# Patient Record
Sex: Female | Born: 1970 | Race: White | Hispanic: No | Marital: Married | Smoking: Never smoker
Health system: Southern US, Community
[De-identification: ages and names within clinical notes are randomized; demographics above are authoritative.]

## PROBLEM LIST (undated history)

## (undated) DIAGNOSIS — Z789 Other specified health status: Secondary | ICD-10-CM

## (undated) HISTORY — DX: Other specified health status: Z78.9

---

## 1998-03-17 ENCOUNTER — Encounter: Admission: RE | Admit: 1998-03-17 | Discharge: 1998-06-15 | Payer: Self-pay | Admitting: Gynecology

## 1998-06-18 ENCOUNTER — Encounter: Payer: Self-pay | Admitting: *Deleted

## 1998-06-18 ENCOUNTER — Ambulatory Visit (HOSPITAL_COMMUNITY): Admission: RE | Admit: 1998-06-18 | Discharge: 1998-06-18 | Payer: Self-pay | Admitting: *Deleted

## 1998-07-15 ENCOUNTER — Inpatient Hospital Stay (HOSPITAL_COMMUNITY): Admission: AD | Admit: 1998-07-15 | Discharge: 1998-07-15 | Payer: Self-pay | Admitting: Gynecology

## 1998-07-17 ENCOUNTER — Inpatient Hospital Stay (HOSPITAL_COMMUNITY): Admission: AD | Admit: 1998-07-17 | Discharge: 1998-07-19 | Payer: Self-pay | Admitting: Gynecology

## 1998-07-23 ENCOUNTER — Encounter: Payer: Self-pay | Admitting: Obstetrics and Gynecology

## 1998-07-23 ENCOUNTER — Ambulatory Visit (HOSPITAL_COMMUNITY): Admission: RE | Admit: 1998-07-23 | Discharge: 1998-07-23 | Payer: Self-pay | Admitting: Obstetrics and Gynecology

## 1998-09-08 ENCOUNTER — Encounter: Payer: Self-pay | Admitting: Gynecology

## 1998-09-08 ENCOUNTER — Inpatient Hospital Stay (HOSPITAL_COMMUNITY): Admission: RE | Admit: 1998-09-08 | Discharge: 1998-09-08 | Payer: Self-pay | Admitting: Gynecology

## 1998-09-10 ENCOUNTER — Inpatient Hospital Stay (HOSPITAL_COMMUNITY): Admission: AD | Admit: 1998-09-10 | Discharge: 1998-09-13 | Payer: Self-pay | Admitting: Obstetrics and Gynecology

## 1998-10-21 ENCOUNTER — Other Ambulatory Visit: Admission: RE | Admit: 1998-10-21 | Discharge: 1998-10-21 | Payer: Self-pay | Admitting: Gynecology

## 2000-04-02 ENCOUNTER — Other Ambulatory Visit: Admission: RE | Admit: 2000-04-02 | Discharge: 2000-04-02 | Payer: Self-pay | Admitting: *Deleted

## 2000-08-22 ENCOUNTER — Inpatient Hospital Stay (HOSPITAL_COMMUNITY): Admission: AD | Admit: 2000-08-22 | Discharge: 2000-08-24 | Payer: Self-pay | Admitting: Gynecology

## 2000-08-22 ENCOUNTER — Encounter: Payer: Self-pay | Admitting: Gynecology

## 2000-10-04 ENCOUNTER — Encounter: Payer: Self-pay | Admitting: Gynecology

## 2000-10-04 ENCOUNTER — Ambulatory Visit (HOSPITAL_COMMUNITY): Admission: RE | Admit: 2000-10-04 | Discharge: 2000-10-04 | Payer: Self-pay | Admitting: *Deleted

## 2000-10-06 ENCOUNTER — Encounter (INDEPENDENT_AMBULATORY_CARE_PROVIDER_SITE_OTHER): Payer: Self-pay | Admitting: Specialist

## 2000-10-06 ENCOUNTER — Inpatient Hospital Stay (HOSPITAL_COMMUNITY): Admission: AD | Admit: 2000-10-06 | Discharge: 2000-10-09 | Payer: Self-pay | Admitting: Gynecology

## 2000-11-19 ENCOUNTER — Other Ambulatory Visit: Admission: RE | Admit: 2000-11-19 | Discharge: 2000-11-19 | Payer: Self-pay | Admitting: Gynecology

## 2001-09-17 ENCOUNTER — Emergency Department (HOSPITAL_COMMUNITY): Admission: EM | Admit: 2001-09-17 | Discharge: 2001-09-18 | Payer: Self-pay | Admitting: Internal Medicine

## 2001-09-18 ENCOUNTER — Encounter: Payer: Self-pay | Admitting: Internal Medicine

## 2003-03-09 ENCOUNTER — Emergency Department (HOSPITAL_COMMUNITY): Admission: EM | Admit: 2003-03-09 | Discharge: 2003-03-09 | Payer: Self-pay | Admitting: *Deleted

## 2003-03-09 ENCOUNTER — Encounter: Payer: Self-pay | Admitting: *Deleted

## 2007-09-12 ENCOUNTER — Emergency Department (HOSPITAL_COMMUNITY): Admission: EM | Admit: 2007-09-12 | Discharge: 2007-09-13 | Payer: Self-pay | Admitting: Emergency Medicine

## 2010-12-02 NOTE — Op Note (Signed)
[facility]  Patient:    Carrie Copeland, Carrie Copeland                 MRN: [id number] Proc. Date: 10/06/00 Adm. Date:  [id number] Attending:  Tonye Royalty                           Operative Report  PREOPERATIVE DIAGNOSES:       1. Term intrauterine pregnancy.                               2. Spontaneous rupture of membrane.                               3. Persistent fetal bradycardia.  POSTOPERATIVE DIAGNOSES:      1. Term intrauterine pregnancy.                               2. Spontaneous rupture of membrane.                               3. Persistent fetal bradycardia.  PROCEDURE PERFORMED:          Emergency primary lower uterine segment                               transverse cesarean section.  SURGEON:                      Juan H. Lily Peer, M.D.  FIRST ASSISTANT:              Conni Elliot, M.D.  ANESTHESIA:                   Emergency general endotracheal anesthesia.  ESTIMATED BLOOD LOSS:  INDICATIONS:                  A 40 year old gravida 2, para 1, with last menstrual period January 15, 2000.  Estimated date of confinement of October 20, 2000.  The patient is currently [redacted] weeks gestation and had presented to [facility] at 1631 hours complaining of spontaneous rupture of membranes at approximately 1545 hours.  On arrival to [facility], her blood pressure was 136/91 and then 122/83.  Pulse was 85.  Temperature had not been taken yet and she was contracting every 2-4 minutes apart with the fetal heart rate reassuring at 140-145 beats per minute.  She had gross rupture of membranes, positive nitrazine, and positive ferning.  She was brought to labor and delivery, and was contracting spontaneously with a reassuring heart rate tracing and stable vital signs at approximately 1737 hours, and had an episode of fetal bradycardia that did not respond to O2 administration, lateral positioning, or knee/chest position.  She was  taken to the operating room for an emergency C-section.  The baby was delivered at 1757 hours.  Of note, there has been discrepancy between the fetal heart rate monitor time and the operating room time.  The fetal heart rate monitoring strip states that the patient arrived in the operating room at 1749 hours and the baby was delivered at 1757 hours.  The patient was taken to  the operating room by Dr. Corky Sox due to the fact that I was in route to the hospital and the fetal bradycardia was still persistent.  Upon my arrival, the patients abdomen was prepped and draped, and I was able to initiate the skin incision and start the cesarean section. Of note, the heart rate before commencing the cesarean section in the operating room, the heart rate was in the 100 beat per minute range.  DESCRIPTION OF PROCEDURE:     After the patient was verbally counseled in route to the operating room.  Once arriving to the operating room after the drapes were in place, the fetal scalp electrode was removed.  The abdomen was prepped and draped in the usual sterile fashion.  A Pfannenstiel skin incision was made 2 cm above the symphysis pubis.  The incision was carried down from the skin and subcutaneous tissue down to the rectus fascia whereby a midline nick was made.  The fascia was incised in a transverse fashion.  The midline raphe was entered and the peritoneal cavity was entered.  The bladder flap was established and the lower uterine segment was incised in a transverse fashion. Clear amniotic fluid was present.  The newborns head was delivered as was the rest of the body.  The nasopharyngeal area was bulb suctioned.  The cord was doubly clamped and tied, and passed off to the pediatricians that were in attendance.  After cord blood was obtained, the placenta was delivered from the intrauterine cavity, and the remaining products of conception were removed. The uterus was then exteriorized and  the lower uterine segment incision was closed with a running stitch of 0 Vicryl suture.  The uterus was then placed back into the abdominopelvic cavity, and the rectus fascia was closed with a running stitch of 0 Vicryl suture.  The subcutaneous bleeders were Bovie cauterized and the skin was reapproximated with skin clips followed by placement of Xeroform gauze.  The patient was then extubated and transferred to the recovery room with stable vital signs.  Blood loss for the procedure was 800 cc.  Urine output was 100 cc.  IV fluid was 1500 cc of lactated Ringers.  The patient delivered a viable female infant with Apgars of 7 and 8.  Arterial cord pH was 7.13 and a weight of 8 pounds and 15 ounces. DD:  10/06/00 TD:  10/08/00 Job: [id number]

## 2010-12-02 NOTE — Discharge Summary (Signed)
[facility]  Patient:    Carrie Copeland, Carrie Copeland                 MRN: [id number] Adm. Date:  [id number] Disc. Date: [id number] Attending:  Tonye Royalty Dictator:   Antony Contras, N.P.                           Discharge Summary  DISCHARGE DIAGNOSES:          Intrauterine pregnancy at [redacted] weeks gestation, spontaneous rupture of membranes, persistent fetal bradycardia.  PROCEDURE:                    Emergency primary low cervical transverse cesarean section.  HISTORY OF PRESENT ILLNESS:   Patient is a 40 year old gravida 2, para 1-0-0-1 with LMP January 21, 2000, [facility] October 20, 2000.  Prenatal risk factors include a history of bilateral hydroureter in patients daughter.  Husband also had a nephrectomy for unilateral renal blockage.  Also, renal pyelectasis and macrosomia in fetus.  PRENATAL LABORATORIES:        Blood type O+.  Antibody screen negative.  RPR, HBSAG, HIV nonreactive.  Rubella immune.  HOSPITAL COURSE:              Patient was admitted on October 06, 2000 with spontaneous rupture of membranes.  Initially upon admission she was noted to be contracting spontaneously with reassuring heart rate, stable vital signs, and then developed an episode of fetal bradycardia which did not respond to oxygen, lateral positioning, or knee-chest position.  She was immediately taken to the operating room for an emergency low cervical transverse cesarean section and was delivered of a viable 7/8 Apgar female weighing 8 pounds 5 ounces.  Procedure was performed by Dr. Lily Peer, assisted by Dr. Corky Sox under emergency general endotracheal anesthesia.  Postoperatively patient remained afebrile, had no difficulty voiding, was able to be discharged in satisfactory condition on her third postoperative day.  LABORATORIES:                 CBC:  Hematocrit 31.3, hemoglobin 10.4, platelets 163,000.  DISPOSITION:                  Follow-up in six weeks.   Continue prenatal vitamins and iron.  Motrin and Tylox for pain.DD:  10/29/00 TD:  10/29/00 Job: [id number]
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
## 2011-10-28 ENCOUNTER — Emergency Department (HOSPITAL_COMMUNITY): Payer: 59

## 2011-10-28 ENCOUNTER — Encounter (HOSPITAL_COMMUNITY): Payer: Self-pay | Admitting: *Deleted

## 2011-10-28 ENCOUNTER — Emergency Department (HOSPITAL_COMMUNITY)
Admission: EM | Admit: 2011-10-28 | Discharge: 2011-10-29 | Disposition: A | Discharge status: Home / Self Care | Payer: 59 | Attending: Emergency Medicine | Admitting: Emergency Medicine

## 2011-10-28 DIAGNOSIS — D72829 Elevated white blood cell count, unspecified: Secondary | ICD-10-CM | POA: Insufficient documentation

## 2011-10-28 DIAGNOSIS — R079 Chest pain, unspecified: Secondary | ICD-10-CM | POA: Insufficient documentation

## 2011-10-28 MED ORDER — FAMOTIDINE 20 MG PO TABS
20.0000 mg | ORAL_TABLET | Freq: Once | ORAL | Status: AC
  Administered 2011-10-29: 20 mg via ORAL
  Filled 2011-10-28: qty 1

## 2011-10-28 MED ORDER — NITROGLYCERIN 0.4 MG SL SUBL
0.4000 mg | SUBLINGUAL_TABLET | SUBLINGUAL | Status: DC | PRN
  Filled 2011-10-28: qty 25

## 2011-10-28 MED ORDER — GI COCKTAIL ~~LOC~~
30.0000 mL | Freq: Once | ORAL | Status: AC
  Administered 2011-10-29: 30 mL via ORAL
  Filled 2011-10-28: qty 30

## 2011-10-28 MED ORDER — PANTOPRAZOLE SODIUM 40 MG IV SOLR
40.0000 mg | Freq: Once | INTRAVENOUS | Status: AC
  Administered 2011-10-29: 40 mg via INTRAVENOUS
  Filled 2011-10-28: qty 40

## 2011-10-28 NOTE — ED Notes (Signed)
Pt reports chest pain began about 30 minutes ago.  States that pain is in middle of chest. Reports mild nausea and SOB with pain.

## 2011-10-29 LAB — COMPREHENSIVE METABOLIC PANEL
AST: 38 U/L — ABNORMAL HIGH (ref 0–37)
CO2: 27 mEq/L (ref 19–32)
Calcium: 9.2 mg/dL (ref 8.4–10.5)
Creatinine, Ser: 0.81 mg/dL (ref 0.50–1.10)
GFR calc Af Amer: >90 mL/min (ref >90)
GFR calc non Af Amer: >90 mL/min (ref >90)
Glucose, Bld: 117 mg/dL — ABNORMAL HIGH (ref 70–99)
Total Protein: 7.4 g/dL (ref 6.0–8.3)

## 2011-10-29 LAB — CBC
HCT: 36.9 % (ref 36.0–46.0)
MCHC: 33.1 g/dL (ref 30.0–36.0)
Platelets: 261 10*3/uL (ref 150–400)
RDW: 12.9 % (ref 11.5–15.5)
WBC: 21.7 10*3/uL — ABNORMAL HIGH (ref 4.0–10.5)

## 2011-10-29 LAB — POCT I-STAT, CHEM 8
BUN: 17 mg/dL (ref 6–23)
Chloride: 105 mEq/L (ref 96–112)
HCT: 38 % (ref 36.0–46.0)
Potassium: 3.6 mEq/L (ref 3.5–5.1)

## 2011-10-29 LAB — D-DIMER, QUANTITATIVE: D-Dimer, Quant: <0.22 ug/mL-FEU (ref 0.00–0.48)

## 2011-10-29 LAB — URINALYSIS, ROUTINE W REFLEX MICROSCOPIC
Glucose, UA: NEGATIVE mg/dL
Ketones, ur: NEGATIVE mg/dL
Leukocytes, UA: NEGATIVE
Protein, ur: NEGATIVE mg/dL
Urobilinogen, UA: 1 mg/dL (ref 0.0–1.0)

## 2011-10-29 LAB — POCT I-STAT TROPONIN I: Troponin i, poc: 0.01 ng/mL (ref 0.00–0.08)

## 2011-10-29 MED ORDER — PANTOPRAZOLE SODIUM 20 MG PO TBEC: 20.0000 mg | DELAYED_RELEASE_TABLET | Freq: Every day | ORAL | Status: DC

## 2011-10-29 MED ORDER — FAMOTIDINE 20 MG PO TABS: 20.0000 mg | ORAL_TABLET | Freq: Two times a day (BID) | ORAL | Status: DC

## 2011-10-29 NOTE — ED Provider Notes (Signed)
History     CSN: [id number]  Arrival date & time 10/28/11  2319   First MD Initiated Contact with Patient 10/28/11 2336      Chief Complaint  Patient presents with  . Chest Pain    (Consider location/radiation/quality/duration/timing/severity/associated sxs/prior treatment) HPI Substernal chest pain, feels like gas. Pain started epigastric region and moved up. No belching. Unable to sleep due to discomfort that started while at rest. No history of similar symptoms. Patient states she had a big lunch and ate pizza. For dinner she had macaroni and cheese. No fevers or chills. No cough. No back pain. No dysuria. No shortness of breath. No known aggravating or alleviating factors. No leg pain or swelling. A moderate in severity with more severe prior to arriving at the emergency department. She did not take any medications for this.  History reviewed. No pertinent past medical history.  Past Surgical History  Procedure Date  . Cesarean section     No family history on file.  History  Substance Use Topics  . Smoking status: Never Smoker   . Smokeless tobacco: Not on file  . Alcohol Use: No    OB History    Grav Para Term Preterm Abortions TAB SAB Ect Mult Living                  Review of Systems  Constitutional: Negative for fever and chills.  HENT: Negative for neck pain and neck stiffness.   Eyes: Negative for pain.  Respiratory: Negative for shortness of breath.   Cardiovascular: Positive for chest pain. Negative for palpitations and leg swelling.  Gastrointestinal: Negative for abdominal pain.  Genitourinary: Negative for dysuria.  Musculoskeletal: Negative for back pain.  Skin: Negative for rash.  Neurological: Negative for headaches.  All other systems reviewed and are negative.    Allergies  Review of patient's allergies indicates no known allergies.  Home Medications  No current outpatient prescriptions on file.  BP 125/73  Pulse 66  Temp(Src) 98.3  F (36.8 C) (Oral)  Resp 18  Ht 5\' 6"  (1.676 m)  Wt 190 lb (86.183 kg)  BMI 30.67 kg/m2  SpO2 100%  Physical Exam  Constitutional: She is oriented to person, place, and time. She appears well-developed and well-nourished.  HENT:  Head: Normocephalic and atraumatic.  Eyes: Conjunctivae and EOM are normal. Pupils are equal, round, and reactive to light.  Neck: Trachea normal. Neck supple. No thyromegaly present.  Cardiovascular: Normal rate, regular rhythm, S1 normal, S2 normal and normal pulses.     No systolic murmur is present   No diastolic murmur is present  Pulses:      Radial pulses are 2+ on the right side, and 2+ on the left side.  Pulmonary/Chest: Effort normal and breath sounds normal. She has no wheezes. She has no rhonchi. She has no rales. She exhibits no tenderness.  Abdominal: Soft. Normal appearance and bowel sounds are normal. She exhibits no mass. There is no tenderness. There is no rebound, no guarding, no CVA tenderness and negative Murphy's sign.  Musculoskeletal:       BLE:s Calves nontender, no cords or erythema, negative Homans sign  Neurological: She is alert and oriented to person, place, and time. She has normal strength. No cranial nerve deficit or sensory deficit. GCS eye subscore is 4. GCS verbal subscore is 5. GCS motor subscore is 6.  Skin: Skin is warm and dry. No rash noted. She is not diaphoretic.  Psychiatric: Her speech is  normal.       Cooperative and appropriate    ED Course  Procedures (including critical care time)  Labs Reviewed  CBC - Abnormal; Notable for the following:    WBC 21.7 (*)    All other components within normal limits  COMPREHENSIVE METABOLIC PANEL - Abnormal; Notable for the following:    Glucose, Bld 117 (*)    AST 38 (*)    All other components within normal limits  POCT I-STAT, CHEM 8 - Abnormal; Notable for the following:    Glucose, Bld 111 (*)    All other components within normal limits  D-DIMER, QUANTITATIVE    URINALYSIS, ROUTINE W REFLEX MICROSCOPIC   Dg Chest Portable 1 View  10/29/2011  *RADIOLOGY REPORT*  Clinical Data: Chest pain  PORTABLE CHEST - 1 VIEW  Comparison: None.  Findings: Lungs are essentially clear.  No pleural effusion or pneumothorax.  Cardiomediastinal silhouette is within normal limits.  IMPRESSION: No evidence of acute cardiopulmonary disease.  Original Report Authenticated By: Charline Bills, M.D.    Date: 10/29/2011  Rate: 68  Rhythm: normal sinus rhythm  QRS Axis: normal  Intervals: normal  ST/T Wave abnormalities: nonspecific ST changes  Conduction Disutrbances:none  Narrative Interpretation:   Old EKG Reviewed: none available  ECG- no acute ischemia. Negative cardiac enzymes.  1:51 AM on recheck feels much better after GI cocktail, Protonix and Pepcid.   Labs and x-ray reviewed. Unable to explain elevated white blood cell count 21K - no UTI symptoms, no fevers, no cough, no abdominal tenderness. CMP reviewed without elevated LFTs doubt cholecystitis.    MDM   Chest pain with reflux symptoms improved with GI cocktail, Pepcid, Protonix. Screening EKG and x-ray reviewed as above.  Patient also has elevated white blood cell count without obvious etiology. Plan recheck primary care physician for repeat labs and further evaluation. Reliable historian and agrees to strict return precautions for any worsening condition.        Sunnie Nielsen, MD 10/29/11 [phone]

## 2011-10-29 NOTE — Discharge Instructions (Signed)

## 2011-11-03 ENCOUNTER — Other Ambulatory Visit (HOSPITAL_COMMUNITY): Payer: Self-pay | Admitting: Pediatrics

## 2011-11-07 ENCOUNTER — Ambulatory Visit (HOSPITAL_COMMUNITY)
Admission: RE | Admit: 2011-11-07 | Discharge: 2011-11-07 | Disposition: A | Discharge status: Home / Self Care | Payer: 59 | Source: Ambulatory Visit | Attending: Pediatrics | Admitting: Pediatrics

## 2011-11-07 ENCOUNTER — Other Ambulatory Visit (HOSPITAL_COMMUNITY): Payer: Self-pay | Admitting: Pediatrics

## 2011-11-07 DIAGNOSIS — R109 Unspecified abdominal pain: Secondary | ICD-10-CM | POA: Insufficient documentation

## 2011-11-14 ENCOUNTER — Other Ambulatory Visit (HOSPITAL_COMMUNITY): Payer: Self-pay | Admitting: Pediatrics

## 2011-11-14 DIAGNOSIS — R1011 Right upper quadrant pain: Secondary | ICD-10-CM

## 2011-11-14 DIAGNOSIS — R110 Nausea: Secondary | ICD-10-CM

## 2011-11-16 ENCOUNTER — Encounter (HOSPITAL_COMMUNITY)
Admission: RE | Admit: 2011-11-16 | Discharge: 2011-11-16 | Disposition: A | Discharge status: Home / Self Care | Payer: 59 | Source: Ambulatory Visit | Attending: Pediatrics | Admitting: Pediatrics

## 2011-11-16 ENCOUNTER — Encounter (HOSPITAL_COMMUNITY): Payer: Self-pay

## 2011-11-16 DIAGNOSIS — R1011 Right upper quadrant pain: Secondary | ICD-10-CM

## 2011-11-16 DIAGNOSIS — R110 Nausea: Secondary | ICD-10-CM

## 2011-11-16 LAB — CBC WITH DIFFERENTIAL/PLATELET
Alkaline Phosphatase: 85 U/L
Amylase: 41 units/L (ref 25–110)
HCT: 43 %
Hemoglobin: 13.8 g/dL (ref 12.0–16.0)
MCV: 95.8 fL
platelet count: 315

## 2011-11-16 MED ORDER — SINCALIDE 5 MCG IJ SOLR
INTRAMUSCULAR | Status: AC
  Administered 2011-11-16: 1.7 ug via INTRAVENOUS
  Filled 2011-11-16: qty 5

## 2011-11-16 MED ORDER — TECHNETIUM TC 99M MEBROFENIN IV KIT
5.0000 | PACK | Freq: Once | INTRAVENOUS | Status: AC | PRN
  Administered 2011-11-16: 4.9 via INTRAVENOUS

## 2011-12-06 ENCOUNTER — Encounter: Payer: Self-pay | Admitting: Gastroenterology

## 2011-12-06 ENCOUNTER — Ambulatory Visit (INDEPENDENT_AMBULATORY_CARE_PROVIDER_SITE_OTHER): Payer: 59 | Admitting: Gastroenterology

## 2011-12-06 VITALS — BP 102/70 | HR 76 | Temp 98.7°F | Ht 66.5 in | Wt 188.2 lb

## 2011-12-06 DIAGNOSIS — R1011 Right upper quadrant pain: Secondary | ICD-10-CM

## 2011-12-06 DIAGNOSIS — R945 Abnormal results of liver function studies: Secondary | ICD-10-CM

## 2011-12-06 DIAGNOSIS — R7989 Other specified abnormal findings of blood chemistry: Secondary | ICD-10-CM

## 2011-12-06 DIAGNOSIS — K8020 Calculus of gallbladder without cholecystitis without obstruction: Secondary | ICD-10-CM

## 2011-12-06 DIAGNOSIS — K8050 Calculus of bile duct without cholangitis or cholecystitis without obstruction: Secondary | ICD-10-CM

## 2011-12-06 NOTE — Progress Notes (Signed)
Primary Care Physician:  Vivia Ewing, MD, MD  Primary Gastroenterologist:  Roetta Sessions, MD   Chief Complaint  Patient presents with  . Abdominal Pain    HPI:  Carrie Copeland is a 41 y.o. 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 Patient states her symptoms began acutely on the evening of April 14th around 10 PM. She had returned from traveling and admits to having eat out quite a bit in the preceding 48 hours including [place], pizza, chicken biscuits. Her last meal was at lunch time today the symptoms began. She developed acute, severe right upper quadrant pain which radiated into the epigastrium and lower substernal chest region. Her husband took her to the emergency department because the pain was so severe. No nausea or vomiting. In the emergency department, her AST was slightly elevated at 38, other LFTs were normal. Her white blood cell count was 21,700, hemoglobin 12.2. She was given a GI cocktail. Patient states he did not start working immediately and her pain started subsiding possibly after receiving IV pain medication. After she returned home several hours later her pain recurred. She saw Dr. Milford Cage and had blood work on April 18. Her white blood cell count was 9000 at that time. Hemoglobin normal. Total bilirubin 0.5, alkaline phosphatase 1:15, AST 26, ALT 184, albumin 4.6. Abdominal ultrasound on April 23 was negative. HIDA scan with CCK challenge on May 2 showed a gallbladder ejection fraction of 89%, retention of activity noted within the common bile duct during post infusion imaging, suspicious for biliary dyskinesia. Patient reported reproduction of symptoms with CCK infusion. Patient confirmed this today.  For the past one month she has been on pepcid and protonix given during ED visit. Recently ran out. Watchs diet very closely. Avoidance of spicy and fatty foods. Salad causes some ruq. Also  fer stools were light colored for a day. No melena rectal bleeding. Only occasional heartburn, self medicates with TUMS with good relief.    Current Outpatient Prescriptions  Medication Sig Dispense Refill  . famotidine (PEPCID) 20 MG tablet Take 1 tablet (20 mg total) by mouth 2 (two) times daily.  30 tablet  0  . pantoprazole (PROTONIX) 20 MG tablet Take 1 tablet (20 mg total) by mouth daily.  30 tablet  0    Allergies as of 12/06/2011  . (No Known Allergies)    Past Medical History  Diagnosis Date  . No pertinent past medical history     Past Surgical History  Procedure Date  . Cesarean section     Family History  Problem Relation Age of Onset  . Colon cancer Neg Hx   . Liver disease Neg Hx     History   Social History  . Marital Status: Married    Spouse Name: N/A    Number of Children: 2  . Years of Education: N/A   Occupational History  . 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 Smoking status: Never Smoker   . Smokeless tobacco: Not on file  . Alcohol Use: No  . Drug Use: No  . Sexually Active:    Other Topics Concern  . Not on file   Social History Narrative  . No narrative on file      ROS:  General: Negative for anorexia, weight loss, fever, chills, fatigue, weakness. Eyes: Negative for vision changes.  ENT: Negative for hoarseness, difficulty  swallowing , nasal congestion. CV: Negative for chest pain, angina, palpitations, dyspnea on exertion, peripheral edema.  Respiratory: Negative for dyspnea at rest, dyspnea on exertion, cough, sputum, wheezing.  GI: See history of present illness. GU:  Negative for dysuria, hematuria, urinary incontinence, urinary frequency, nocturnal urination.  MS: Negative for joint pain, low back pain.  Derm: Negative for rash or itching.  Neuro: Negative for weakness, abnormal sensation, seizure, frequent headaches, memory loss, confusion.  Psych: Negative for anxiety, depression, suicidal  ideation, hallucinations.  Endo: Negative for unusual weight change.  Heme: Negative for bruising or bleeding. Allergy: Negative for rash or hives.    Physical Examination:  BP 102/70  Pulse 76  Temp(Src) 98.7 F (37.1 C) (Temporal)  Ht 5' 6.5" (1.689 m)  Wt 188 lb 3.2 oz (85.367 kg)  BMI 29.92 kg/m2   General: Well-nourished, well-developed in no acute distress.  Head: Normocephalic, atraumatic.   Eyes: Conjunctiva pink, no icterus. Mouth: Oropharyngeal mucosa moist and pink , no lesions erythema or exudate. Neck: Supple without thyromegaly, masses, or lymphadenopathy.  Lungs: Clear to auscultation bilaterally.  Heart: Regular rate and rhythm, no murmurs rubs or gallops.  Abdomen: Bowel sounds are normal, nontender, nondistended, no hepatosplenomegaly or masses, no abdominal bruits or    hernia , no rebound or guarding.   Rectal: not performed Extremities: No lower extremity edema. No clubbing or deformities.  Neuro: Alert and oriented x 4 , grossly normal neurologically.  Skin: Warm and dry, no rash or jaundice.   Psych: Alert and cooperative, normal mood and affect.  Labs: Lab Results  Component Value Date   WBC 21.7* 10/29/2011   HGB 12.9 10/29/2011   HCT 38.0 10/29/2011   MCV 93.7 10/29/2011   PLT 261 10/29/2011   Lab Results  Component Value Date   ALT 26 10/29/2011   AST 38* 10/29/2011   ALKPHOS 83 10/29/2011   BILITOT 0.4 10/29/2011   See HPI for other labs.  Imaging Studies: Nm Hepato W/eject Fract  11/16/2011  *RADIOLOGY REPORT*  Clinical Data:  Right upper quadrant pain and nausea.  NUCLEAR MEDICINE HEPATOBILIARY IMAGING WITH GALLBLADDER EF  Technique:  Sequential images of the abdomen were obtained out to 60 minutes following intravenous administration of radiopharmaceutical.  After slow intravenous infusion of 1.7 micrograms Cholecystokinin, gallbladder ejection fraction was determined.  Radiopharmaceutical:  4.9 mCi Tc-27m mebrofenin  Comparison:  None.   Findings: Prompt radiopharmaceutical uptake by the liver is seen. Breast attenuation artifact is noted, but the liver is otherwise normal in appearance.  Prompt biliary excretion activity is seen.  Gallbladder activity is seen initially on the 25-minute image.  Biliary activity reaches small bowel by 35 minutes.  During intravenous infusion of cholecystokinin, the gallbladder ejection fraction reaches 89%, which is normal.  There is retention of a significant amount of biliary activity within the common bile duct during post infusion imaging, and this is suspicious for biliary dyskinesia.  The patient did experience abdominal pain during CCK infusion.  IMPRESSION:  1.  No evidence of cystic duct or biliary obstruction. 2.  Normal gallbladder ejection fraction of 89%. 3.  Retention of activity noted within the common bile duct during post infusion imaging, suspicious for biliary dyskinesia. The patient did report abdominal pain during CCK infusion.  Original Report Authenticated By: Danae Orleans, M.D.   US Abdomen Limited Ruq  11/07/2011  *RADIOLOGY REPORT*  Clinical Data:  Abdominal pain  LIMITED ABDOMINAL ULTRASOUND - RIGHT UPPER QUADRANT  Comparison:  None.  Findings:  Gallbladder:  No gallstones, gallbladder wall thickening, or pericholecystic fluid.  Negative sonographic Murphy's sign.  Common bile duct:  Measures 5 mm.  Liver:  Within normal limits for parenchymal echogenicity.  No focal hepatic lesion is seen.  IMPRESSION: Negative right upper quadrant ultrasound.  Original Report Authenticated By: Charline Bills, M.D.

## 2011-12-06 NOTE — Assessment & Plan Note (Signed)
Recent acute onset right upper quadrant/epigastric abdominal pain requiring emergency department visit. Reproduction of symptoms with CCK challenge at time of HIDA scan. Notably patient also had bump in her ALT as mentioned above. With food avoidance she has done recently well at this point. I don't think that she has significant GERD or gastritis/peptic ulcer disease that explains her symptoms. Suspect her symptoms are biliary in nature. 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 I have provided her with orders to get her LFTs done 6 hours after her next episode. Patient tells me she had 2 sets of labs done through Dr. Kerin Ransom office. Request all labs. Further recommendations to follow.

## 2011-12-06 NOTE — Progress Notes (Signed)
Faxed to PCP

## 2011-12-07 LAB — HEPATIC FUNCTION PANEL
ALT: 184 U/L — AB (ref 7–35)
AST: 26 U/L
Alkaline Phosphatase: 115 U/L
Bilirubin, Direct: 0.2 mg/dL (ref 0.01–0.4)
Total Bilirubin: 0.5 mg/dL

## 2011-12-18 NOTE — Progress Notes (Signed)
Requested additional labs from PCP but they did not have any.

## 2011-12-18 NOTE — Progress Notes (Signed)
Quick Note:  Only one set of labs from PCP available. WBC much improved.  ALT elevated, previously normal.  Patient to have repeat labs with recurrent pain. To discuss work-up with Dr. Jena Gauss when he returns to work. ______

## 2011-12-25 NOTE — Progress Notes (Signed)
Quick Note:  Received additional labs from PCP. LFTs normal this time. To discuss with RMR upon return. ?SOD? ______

## 2012-02-01 NOTE — Progress Notes (Signed)
Quick Note:  Please get progress report on patient. Seen in 11/2011 with orders for LFTs with next episode of pain. No additional labs done so I assume she has not had another episode.   Discussed with Dr. Jena Gauss. He recommends offering Levsin to have on hand in case another episode of ruq pain. Can call in #[phone] to take one under tongue up to four times daily as needed for abd pain. No refills.  Patient needs OV in 03/2012. If she were to have additional episodes, we would consider mrcp vs eus as next step. ______

## 2012-02-06 LAB — HEPATIC FUNCTION PANEL
Bilirubin, Direct: 0.1 mg/dL (ref 0.0–0.3)
Total Bilirubin: 0.5 mg/dL (ref 0.3–1.2)

## 2012-02-06 NOTE — Progress Notes (Signed)
Quick Note:  Please let pt know her LFTs are normal. How is she doing with regards to pain? ______

## 2012-02-08 NOTE — Progress Notes (Signed)
Quick Note:  Pt aware, rx called to [facility].  Darl Pikes, pt needs ov in 03/2012 please ______

## 2012-02-08 NOTE — Progress Notes (Signed)
Quick Note:  Tried to call pt- LMOM ______ 

## 2012-02-08 NOTE — Progress Notes (Signed)
Quick Note:  Pt aware, she was awakened at 5:30am on Sunday morning with severe pain. She was not able to do these labs until Monday. This was the first severe episode she has had since her ov. She is going to try the levsin that RMR recommended and will come to ov in 03/2012. ______

## 2012-02-09 NOTE — Progress Notes (Signed)
Quick Note:  Agree with OV in 03/2012 with RMR. Levsin prn pain. ______

## 2012-04-02 ENCOUNTER — Encounter: Payer: Self-pay | Admitting: Internal Medicine

## 2012-09-29 IMAGING — CR DG CHEST 1V PORT
1 series · 1 of 1 positions shown · non-contrast
Comparison: None.

CLINICAL DATA: Chest pain

PORTABLE CHEST - 1 VIEW

[view not recorded]
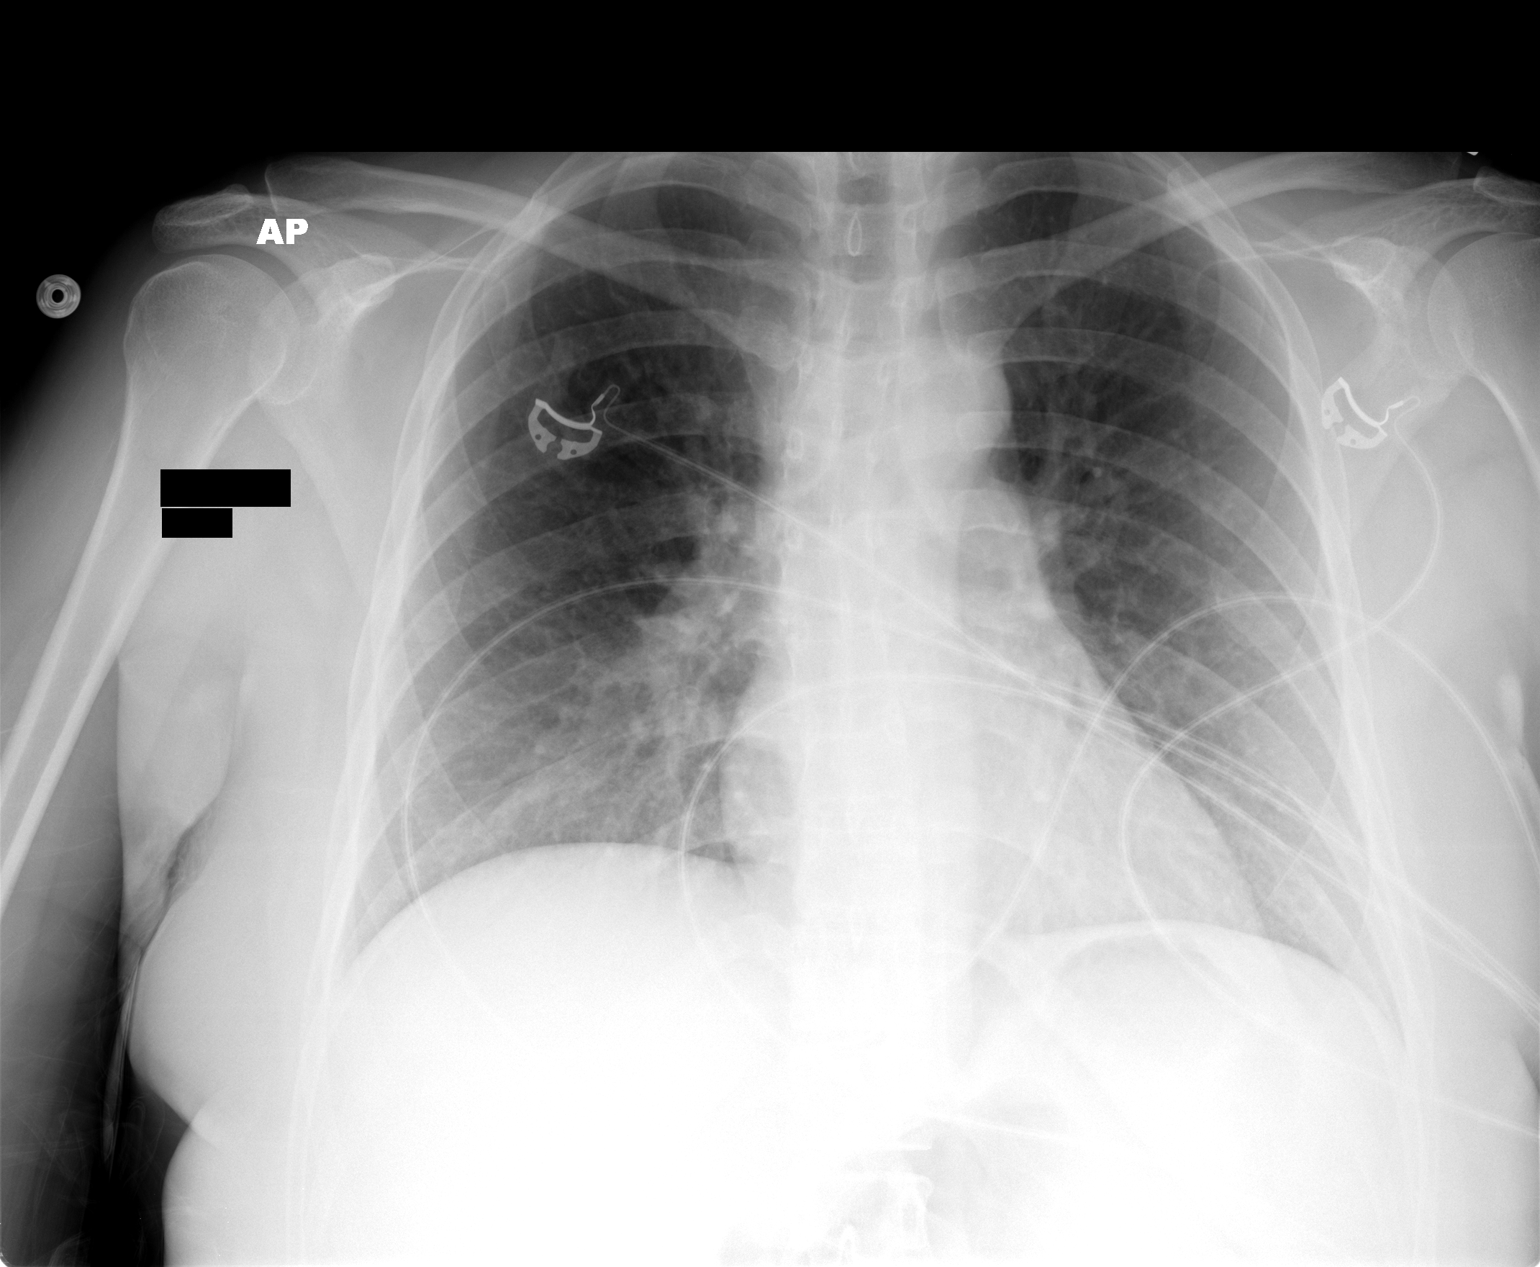

[1 of 1 positions shown; findings below may reference images not displayed]

FINDINGS: Lungs are essentially clear.  No pleural effusion or
pneumothorax.

Cardiomediastinal silhouette is within normal limits.
IMPRESSION: No evidence of acute cardiopulmonary disease.

## 2012-10-17 IMAGING — NM NM HEPATO W/GB/PHARM/[PERSON_NAME]
2 series · 12 of 12 positions shown · non-contrast
Comparison: None.

CLINICAL DATA: Right upper quadrant pain and nausea.

NUCLEAR MEDICINE HEPATOBILIARY IMAGING WITH GALLBLADDER EF
TECHNIQUE: Sequential images of the abdomen were obtained [DATE] minutes following intravenous administration of
radiopharmaceutical.  After slow intravenous infusion of
micrograms Cholecystokinin, gallbladder ejection fraction was
determined.
Radiopharmaceutical:  4.9 mCi Nc-QQm mebrofenin

[hida · 3.20mm/px · 6 of 30 frames shown (1 of 2)]
[frame 3/30]
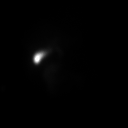
[frame 8/30]
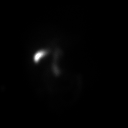
[frame 13/30]
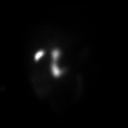
[frame 18/30]
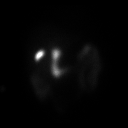
[frame 23/30]
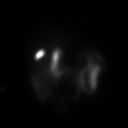
[frame 28/30]
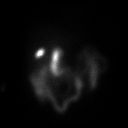

[hida · 3.20mm/px · 6 of 60 frames shown (2 of 2)]
[frame 6/60]
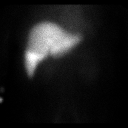
[frame 16/60]
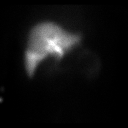
[frame 26/60]
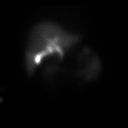
[frame 36/60]
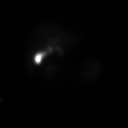
[frame 46/60]
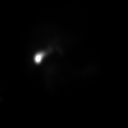
[frame 56/60]
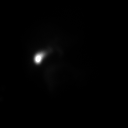

[12 of 12 positions shown; findings below may reference images not displayed]

FINDINGS: Prompt radiopharmaceutical uptake by the liver is seen.
Breast attenuation artifact is noted, but the liver is otherwise
normal in appearance.

Prompt biliary excretion activity is seen.  Gallbladder activity is
seen initially on the 25-minute image.  Biliary activity reaches
small bowel by 35 minutes.

During intravenous infusion of cholecystokinin, the gallbladder
ejection fraction reaches 89%, which is normal.  There is retention
of a significant amount of biliary activity within the common bile
duct during post infusion imaging, and this is suspicious for
biliary dyskinesia.

The patient did experience abdominal pain during CCK infusion.
IMPRESSION: 1.  No evidence of cystic duct or biliary obstruction.
2.  Normal gallbladder ejection fraction of 89%.
3.  Retention of activity noted within the common bile duct during
post infusion imaging, suspicious for biliary dyskinesia. The
patient did report abdominal pain during CCK infusion.

## 2013-10-02 ENCOUNTER — Ambulatory Visit (INDEPENDENT_AMBULATORY_CARE_PROVIDER_SITE_OTHER): Payer: BC Managed Care – PPO

## 2013-10-02 ENCOUNTER — Ambulatory Visit (INDEPENDENT_AMBULATORY_CARE_PROVIDER_SITE_OTHER): Payer: BC Managed Care – PPO | Admitting: Podiatry

## 2013-10-02 ENCOUNTER — Encounter: Payer: Self-pay | Admitting: Podiatry

## 2013-10-02 VITALS — BP 107/77 | HR 81 | Resp 16 | Ht 66.0 in | Wt 190.0 lb

## 2013-10-02 DIAGNOSIS — M722 Plantar fascial fibromatosis: Secondary | ICD-10-CM

## 2013-10-02 MED ORDER — TRIAMCINOLONE ACETONIDE 10 MG/ML IJ SUSP
10.0000 mg | Freq: Once | INTRAMUSCULAR | Status: AC
  Administered 2013-10-02: 10 mg

## 2013-10-02 NOTE — Progress Notes (Signed)
   Subjective:    Patient ID: Carrie Copeland, female    DOB: 02-Jan-1971, 43 y.o.   MRN: [id number]  HPI Comments: "I have this pain in the heel"  Patient c/o throbbing plantar/medial heel left for 1 week. She does NOT have AM pain. 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 PCP said to rest and Rx'd Etodolac. Not helping.  Foot Pain      Review of Systems  Musculoskeletal: Positive for gait problem.  All other systems reviewed and are negative.       Objective:   Physical Exam        Assessment & Plan:

## 2013-10-02 NOTE — Patient Instructions (Signed)

## 2013-10-03 NOTE — Progress Notes (Signed)
Subjective:     Patient ID: Carrie Copeland, female   DOB: 09/14/1970, 43 y.o.   MRN: [id number]  Foot Pain   patient points to the left heel stating she has been having severe discomfort in her left heel and is not sure what might be causing this stating she is unable to bear weight on it. States it's been hurting for couple months but is gotten very much worse the last week   Review of Systems  All other systems reviewed and are negative.       Objective:   Physical Exam  Nursing note and vitals reviewed. Constitutional: She is oriented to person, place, and time.  Cardiovascular: Intact distal pulses.   Musculoskeletal: Normal range of motion.  Neurological: She is oriented to person, place, and time.  Skin: Skin is warm.   neurovascular status intact with health history unchanged and patient has good range of motion subtalar midtarsal joint normal muscle strength and severe discomfort to palpation left heel at the insertion of the tendon into the calcaneus. Patient's digits are well perfused and arch height is normal     Assessment:     Severe plantar fasciitis with no indications currently of stress fracture    Plan:

## 2013-10-09 ENCOUNTER — Encounter: Payer: Self-pay | Admitting: Podiatry

## 2013-10-09 ENCOUNTER — Ambulatory Visit (INDEPENDENT_AMBULATORY_CARE_PROVIDER_SITE_OTHER): Payer: BC Managed Care – PPO | Admitting: Podiatry

## 2013-10-09 VITALS — BP 100/67 | HR 80 | Resp 16

## 2013-10-09 DIAGNOSIS — M722 Plantar fascial fibromatosis: Secondary | ICD-10-CM

## 2013-10-09 MED ORDER — TRIAMCINOLONE ACETONIDE 10 MG/ML IJ SUSP
10.0000 mg | Freq: Once | INTRAMUSCULAR | Status: AC
  Administered 2013-10-09: 10 mg

## 2013-10-10 NOTE — Progress Notes (Signed)
Subjective:     Patient ID: Carrie Copeland, female   DOB: December 28, 1970, 43 y.o.   MRN: [id number]  HPI patient continues to experience discomfort in the left plantar heel at the insertional point with some reduction of inflammation with previous treatment   Review of Systems     Objective:   Physical Exam Neurovascular status intact with pain to palpation medial fascial band left at the insertion of the tendon into the calcaneus    Assessment:     Plantar fasciitis still noted left with inflammation and depression of the arch noted upon weightbearing    Plan:     Mechanical dysfunction with stubborn plantar fasciitis noted left heel. Reinjected the left plantar fascia 3 mg Kenalog 5 mg Xylocaine Marcaine mixture and dispensed night splint with instructions on usage and scanned for custom orthotics to reduce stress against the plantar heel

## 2013-10-30 ENCOUNTER — Ambulatory Visit: Payer: BC Managed Care – PPO | Admitting: Podiatry

## 2013-11-03 ENCOUNTER — Encounter: Payer: Self-pay | Admitting: Podiatry

## 2013-11-03 ENCOUNTER — Ambulatory Visit (INDEPENDENT_AMBULATORY_CARE_PROVIDER_SITE_OTHER): Payer: BC Managed Care – PPO | Admitting: Podiatry

## 2013-11-03 VITALS — BP 115/72 | HR 88 | Resp 12

## 2013-11-03 DIAGNOSIS — M722 Plantar fascial fibromatosis: Secondary | ICD-10-CM

## 2013-11-05 NOTE — Progress Notes (Signed)
Subjective:     Patient ID: Carrie Copeland, female   DOB: Sep 19, 1970, 43 y.o.   MRN: [id number]  HPI patient states my heel is feeling quite a bit better with significant reduction of discomfort   Review of Systems     Objective:   Physical Exam Neurovascular status intact with significant diminishment of discomfort in the plantar fascia with inflammation still noted upon decompression    Assessment:     Improving plantar fasciitis of the heel    Plan:     Reviewed physical therapy and continued anti-inflammatories and supportive shoe gear usage. Dispensed orthotics with all instructions on usage and reviewed reappoint in 4 weeks

## 2014-05-04 ENCOUNTER — Encounter (HOSPITAL_COMMUNITY): Payer: Self-pay | Admitting: Emergency Medicine

## 2014-05-04 ENCOUNTER — Emergency Department (HOSPITAL_COMMUNITY)
Admission: EM | Admit: 2014-05-04 | Discharge: 2014-05-04 | Disposition: A | Discharge status: Home / Self Care | Payer: BC Managed Care – PPO | Source: Home / Self Care | Attending: Family Medicine | Admitting: Family Medicine

## 2014-05-04 DIAGNOSIS — N390 Urinary tract infection, site not specified: Secondary | ICD-10-CM

## 2014-05-04 LAB — POCT URINALYSIS DIP (DEVICE)
GLUCOSE, UA: 100 mg/dL — AB
Nitrite: POSITIVE — AB
PROTEIN: 30 mg/dL — AB
Specific Gravity, Urine: 1.015 (ref 1.005–1.030)
UROBILINOGEN UA: 2 mg/dL — AB (ref 0.0–1.0)
pH: 5 (ref 5.0–8.0)

## 2014-05-04 LAB — POCT PREGNANCY, URINE: PREG TEST UR: NEGATIVE

## 2014-05-04 MED ORDER — CEPHALEXIN 500 MG PO CAPS: 500.0000 mg | ORAL_CAPSULE | Freq: Three times a day (TID) | ORAL | Status: DC

## 2014-05-04 NOTE — Discharge Instructions (Signed)
Thank you for coming in today. Take keflex three times daily for 1 week.  Come back as needed.  Use AZO as needed.  If your belly pain worsens, or you have high fever, bad vomiting, blood in your stool or black tarry stool go to the Emergency Room.   Urinary Tract Infection Urinary tract infections (UTIs) can develop anywhere along your urinary tract. Your urinary tract is your body's drainage system for removing wastes and extra water. Your urinary tract includes two kidneys, two ureters, a bladder, and a urethra. Your kidneys are a pair of bean-shaped organs. Each kidney is about the size of your fist. They are located below your ribs, one on each side of your spine. CAUSES Infections are caused by microbes, which are microscopic organisms, including fungi, viruses, and bacteria. These organisms are so small that they can only be seen through a microscope. Bacteria are the microbes that most commonly cause UTIs. SYMPTOMS  Symptoms of UTIs may vary by age and gender of the patient and by the location of the infection. Symptoms in young women typically include a frequent and intense urge to urinate and a painful, burning feeling in the bladder or urethra during urination. Older women and men are more likely to be tired, shaky, and weak and have muscle aches and abdominal pain. A fever may mean the infection is in your kidneys. Other symptoms of a kidney infection include pain in your back or sides below the ribs, nausea, and vomiting. DIAGNOSIS To diagnose a UTI, your caregiver will ask you about your symptoms. Your caregiver also will ask to provide a urine sample. The urine sample will be tested for bacteria and white blood cells. White blood cells are made by your body to help fight infection. TREATMENT  Typically, UTIs can be treated with medication. Because most UTIs are caused by a bacterial infection, they usually can be treated with the use of antibiotics. The choice of antibiotic and length  of treatment depend on your symptoms and the type of bacteria causing your infection. HOME CARE INSTRUCTIONS  If you were prescribed antibiotics, take them exactly as your caregiver instructs you. Finish the medication even if you feel better after you have only taken some of the medication.  Drink enough water and fluids to keep your urine clear or pale yellow.  Avoid caffeine, tea, and carbonated beverages. They tend to irritate your bladder.  Empty your bladder often. Avoid holding urine for long periods of time.  Empty your bladder before and after sexual intercourse.  After a bowel movement, women should cleanse from front to back. Use each tissue only once. SEEK MEDICAL CARE IF:   You have back pain.  You develop a fever.  Your symptoms do not begin to resolve within 3 days. SEEK IMMEDIATE MEDICAL CARE IF:   You have severe back pain or lower abdominal pain.  You develop chills.  You have nausea or vomiting.  You have continued burning or discomfort with urination. MAKE SURE YOU:   Understand these instructions.  Will watch your condition.  Will get help right away if you are not doing well or get worse. Document Released: 04/12/2005 Document Revised: 01/02/2012 Document Reviewed: 08/11/2011 [facility] Patient Information 2015 [place], [place]. This information is not intended to replace advice given to you by your health care provider. Make sure you discuss any questions you have with your health care provider.

## 2014-05-04 NOTE — ED Notes (Signed)
Pt here today with complaints of painful urination that started yesterday, pt started taking Azo this morning

## 2014-05-04 NOTE — ED Provider Notes (Signed)
Carrie Copeland is a 43 y.o. female who presents to Urgent Care today for urinary frequency urgency and dysuria starting yesterday. No fevers or chills nausea vomiting or diarrhea. Patient has tried AZO which helps. Symptoms are moderate. Her symptoms are consistent with prior UTI.   Past Medical History  Diagnosis Date  . No pertinent past medical history    History  Substance Use Topics  . Smoking status: Never Smoker   . Smokeless tobacco: Not on file  . Alcohol Use: No   ROS as above Medications: No current facility-administered medications for this encounter.   Current Outpatient Prescriptions  Medication Sig Dispense Refill  . cephALEXin (KEFLEX) 500 MG capsule Take 1 capsule (500 mg total) by mouth 3 (three) times daily.  28 capsule  0  . etodolac (LODINE) 400 MG tablet Take 400 mg by mouth 2 (two) times daily.        Exam:  BP 121/81  Pulse 107  Temp(Src) 98.4 F (36.9 C) (Oral)  Resp 16  Ht 5\' 6"  (1.676 m)  Wt 200 lb (90.719 kg)  BMI 32.30 kg/m2 Gen: Well NAD HEENT: ,  MMM Lungs: Normal work of breathing. CTABL Heart: RRR no MRG Abd: NABS, Soft. Nondistended, Nontender no CV angle tenderness to percussion Exts: Brisk capillary refill, warm and well perfused.   Results for orders placed during the hospital encounter of 05/04/14 (from the past 24 hour(s))  POCT PREGNANCY, URINE     Status: None   Collection Time    05/04/14  3:11 PM      Result Value Ref Range   Preg Test, Ur NEGATIVE  NEGATIVE  POCT URINALYSIS DIP (DEVICE)     Status: Abnormal   Collection Time    05/04/14  3:13 PM      Result Value Ref Range   Glucose, UA 100 (*) NEGATIVE mg/dL   Bilirubin Urine SMALL (*) NEGATIVE   Ketones, ur TRACE (*) NEGATIVE mg/dL   Specific Gravity, Urine 1.015  1.005 - 1.030   Hgb urine dipstick SMALL (*) NEGATIVE   pH 5.0  5.0 - 8.0   Protein, ur 30 (*) NEGATIVE mg/dL   Urobilinogen, UA 2.0 (*) 0.0 - 1.0 mg/dL   Nitrite POSITIVE (*) NEGATIVE   Leukocytes, UA  LARGE (*) NEGATIVE   No results found.  Assessment and Plan: 43 y.o. female with urinary tract infection. Culture pending. Treatment with Keflex  Discussed warning signs or symptoms. Please see discharge instructions. Patient expresses understanding.     Gregor Hams, MD 05/04/14 [id number]

## 2014-05-07 LAB — URINE CULTURE: Special Requests: NORMAL

## 2014-05-09 NOTE — ED Notes (Signed)
Urine culture: >100,000 colonies E. Coli.  Pt. adequately treated with Keflex. Roselyn Meier 05/09/2014

## 2015-10-14 ENCOUNTER — Encounter (HOSPITAL_COMMUNITY): Payer: Self-pay | Admitting: *Deleted

## 2015-10-14 ENCOUNTER — Emergency Department (HOSPITAL_COMMUNITY)
Admission: EM | Admit: 2015-10-14 | Discharge: 2015-10-14 | Disposition: A | Discharge status: Home / Self Care | Payer: BC Managed Care – PPO | Source: Home / Self Care | Attending: Emergency Medicine | Admitting: Emergency Medicine

## 2015-10-14 DIAGNOSIS — N390 Urinary tract infection, site not specified: Secondary | ICD-10-CM

## 2015-10-14 DIAGNOSIS — R109 Unspecified abdominal pain: Secondary | ICD-10-CM

## 2015-10-14 DIAGNOSIS — R1010 Upper abdominal pain, unspecified: Secondary | ICD-10-CM

## 2015-10-14 LAB — POCT URINALYSIS DIP (DEVICE)
Bilirubin Urine: NEGATIVE
GLUCOSE, UA: NEGATIVE mg/dL
Ketones, ur: NEGATIVE mg/dL
Nitrite: NEGATIVE
PROTEIN: 100 mg/dL — AB
SPECIFIC GRAVITY, URINE: 1.02 (ref 1.005–1.030)
Urobilinogen, UA: 0.2 mg/dL (ref 0.0–1.0)
pH: 6 (ref 5.0–8.0)

## 2015-10-14 LAB — POCT PREGNANCY, URINE: PREG TEST UR: NEGATIVE

## 2015-10-14 MED ORDER — LEVOFLOXACIN 500 MG PO TABS: 500.0000 mg | ORAL_TABLET | Freq: Every day | ORAL | Status: DC

## 2015-10-14 NOTE — Discharge Instructions (Signed)
Antibiotic Medicine °Antibiotic medicines are used to treat infections caused by bacteria. They work by hurting or killing the germs that are making you sick. °HOW WILL MY MEDICINE BE PICKED? °There are many kinds of antibiotic medicines. To help your doctor pick one, tell your doctor if: °· You have any allergies. °· You are pregnant or plan to get pregnant. °· You are breastfeeding. °· You are taking any medicines. These include over-the-counter medicines, prescription medicines, and herbal remedies. °· You have a medical condition or problem. °If you have questions about why your medicine was picked, ask. °FOR HOW LONG SHOULD I TAKE MY MEDICINE? °Take your medicine for as long as your doctor tells you to. Do not stop taking it when you feel better. If you stop taking it too soon: °· You may start to feel sick again. °· Your infection may get harder to treat. °· New problems may develop. °WHAT IF I MISS A DOSE? °Try not to miss any doses of antibiotic medicine. If you miss a dose: °· Take the dose as soon as you can. °· If you are taking 2 doses a day, take the next dose in 5 to 6 hours. °· If you are taking 3 or more doses a day, take the next dose in 2 to 4 hours. Then go back to the normal schedule. °If you cannot take a missed dose, take the next dose on time. Then take the missed dose after you have taken all the doses as told by your doctor, as if you had one more dose left. °DOES THIS MEDICINE AFFECT BIRTH CONTROL? °Birth control pills may not work while you are on antibiotic medicines. If you are taking birth control pills, keep taking them as usual. Use a second form of birth control, such as a condom. Keep using the second form of birth control until you are finished with your current 1 month cycle of birth control pills. °GET HELP IF: °· You get worse. °· You do not feel better a few days after starting the medicine. °· You throw up (vomit). °· There are white patches in your mouth. °· You have new  joint pain after starting the medicine. °· You have new muscle aches after starting the medicine. °· You had a fever before starting the medicine, and it comes back. °· You have any symptoms of an allergic reaction, such as an itchy rash. If this happens, stop taking the medicine. °GET HELP RIGHT AWAY IF: °· Your pee (urine) turns dark or becomes blood-colored. °· Your skin turns yellow. °· You bruise or bleed easily. °· You have very bad watery poop (diarrhea) and cramps in your belly (abdomen). °· You have a very bad headache. °· You have signs of a very bad allergic reaction, such as: °¨ Trouble breathing. °¨ Wheezing. °¨ Swelling of the lips, tongue, or face. °¨ Fainting. °¨ Blisters on the skin or in the mouth. °If you have signs of a very bad allergic reaction, stop taking the antibiotic medicine right away. °  °This information is not intended to replace advice given to you by your health care provider. Make sure you discuss any questions you have with your health care provider. °  °Document Released: 04/11/2008 Document Revised: 03/24/2015 Document Reviewed: 11/18/2014 °Elsevier Interactive Patient Education ©2016 [employer] ° °Urinary Tract Infection °A urinary tract infection (UTI) can occur any place along the urinary tract. The tract includes the kidneys, ureters, bladder, and urethra. A type of germ called bacteria   often causes a UTI. UTIs are often helped with antibiotic medicine.  °HOME CARE  °· If given, take antibiotics as told by your doctor. Finish them even if you start to feel better. °· Drink enough fluids to keep your pee (urine) clear or pale yellow. °· Avoid tea, drinks with caffeine, and bubbly (carbonated) drinks. °· Pee often. Avoid holding your pee in for a long time. °· Pee before and after having sex (intercourse). °· Wipe from front to back after you poop (bowel movement) if you are a woman. Use each tissue only once. °GET HELP RIGHT AWAY IF:  °· You have back pain. °· You have  lower belly (abdominal) pain. °· You have chills. °· You feel sick to your stomach (nauseous). °· You throw up (vomit). °· Your burning or discomfort with peeing does not go away. °· You have a fever. °· Your symptoms are not better in 3 days. °MAKE SURE YOU:  °· Understand these instructions. °· Will watch your condition. °· Will get help right away if you are not doing well or get worse. °  °This information is not intended to replace advice given to you by your health care provider. Make sure you discuss any questions you have with your health care provider. °  °Document Released: 12/20/2007 Document Revised: 07/24/2014 Document Reviewed: 02/01/2012 °Elsevier Interactive Patient Education ©2016 [employer] Inc[employer] ° °

## 2015-10-14 NOTE — ED Provider Notes (Signed)
CSN: [id number]     Arrival date & time 10/14/15  1503 History   First MD Initiated Contact with Patient 10/14/15 1640     Chief Complaint  Patient presents with  . Flank Pain   (Consider location/radiation/quality/duration/timing/severity/associated sxs/prior Treatment) HPI Onset of flank pain 2 days ago. Dysuria with frequency No vaginal discharge. Previous symptoms of this  Past Medical History  Diagnosis Date  . No pertinent past medical history    Past Surgical History  Procedure Laterality Date  . Cesarean section     Family History  Problem Relation Age of Onset  . Colon cancer Neg Hx   . Liver disease Neg Hx    Social History  Substance Use Topics  . Smoking status: Never Smoker   . Smokeless tobacco: None  . Alcohol Use: No   OB History    No data available     Review of Systems Flank pain, no fever Allergies  Review of patient's allergies indicates no known allergies.  Home Medications   Prior to Admission medications   Medication Sig Start Date End Date Taking? Authorizing Provider  cephALEXin (KEFLEX) 500 MG capsule Take 1 capsule (500 mg total) by mouth 3 (three) times daily. 05/04/14   Gregor Hams, MD  etodolac (LODINE) 400 MG tablet Take 400 mg by mouth 2 (two) times daily.    Historical Provider, MD  levofloxacin (LEVAQUIN) 500 MG tablet Take 1 tablet (500 mg total) by mouth daily. 10/14/15   Konrad Felix, PA   Meds Ordered and Administered this Visit  Medications - No data to display  BP 122/83 mmHg  Pulse 85  Temp(Src) 99.8 F (37.7 C) (Oral)  Resp 16  SpO2 100% No data found.   Physical Exam NURSES NOTES AND VITAL SIGNS REVIEWED. CONSTITUTIONAL: Well developed, well nourished, no acute distress HEENT: normocephalic, atraumatic, right and left TM's are normal EYES: Conjunctiva normal NECK:normal ROM, supple, no adenopathy PULMONARY:No respiratory distress, normal effort, Lungs: CTAb/l, no wheezes, or increased work of  breathing CARDIOVASCULAR: RRR, no murmur ABDOMEN: soft, ND, NT, +'ve BS MUSCULOSKELETAL: Normal ROM of all extremities,  SKIN: warm and dry without rash PSYCHIATRIC: Mood and affect, behavior are normal  ED Course  Procedures (including critical care time)  Labs Review Labs Reviewed  POCT URINALYSIS DIP (DEVICE) - Abnormal; Notable for the following:    Hgb urine dipstick MODERATE (*)    Protein, ur 100 (*)    Leukocytes, UA LARGE (*)    All other components within normal limits  POCT PREGNANCY, URINE    Imaging Review No results found.   Visual Acuity Review  Right Eye Distance:   Left Eye Distance:   Bilateral Distance:    Right Eye Near:   Left Eye Near:    Bilateral Near:      rx levaquin   MDM   1. Flank pain, acute   2. UTI (lower urinary tract infection)     Patient is reassured that there are no issues that require transfer to higher level of care at this time or additional tests. Patient is advised to continue home symptomatic treatment. Patient is advised that if there are new or worsening symptoms to attend the emergency department, contact primary care provider, or return to UC. Instructions of care provided discharged home in stable condition. Return to work/school note provided.   THIS NOTE WAS GENERATED USING A VOICE RECOGNITION SOFTWARE PROGRAM. ALL REASONABLE EFFORTS  WERE MADE TO PROOFREAD THIS DOCUMENT FOR ACCURACY.  I have verbally reviewed the discharge instructions with the patient. A printed AVS was given to the patient.  All questions were answered prior to discharge.      Konrad Felix, [place] 10/14/15 1715

## 2015-10-14 NOTE — ED Notes (Signed)
Pt  Reports  r  Flank  Pain   With  Onset  Of  Symptoms    sev  Days  Ago    -  Pt  denys  Any injury     She  Reports  Some  Nausea  As  Well     pt  Reports  A  History  Of  Kidney  Problems  In  Past

## 2017-03-15 DIAGNOSIS — D122 Benign neoplasm of ascending colon: Secondary | ICD-10-CM | POA: Diagnosis not present

## 2018-01-01 ENCOUNTER — Encounter (HOSPITAL_COMMUNITY): Payer: Self-pay | Admitting: Emergency Medicine

## 2018-01-01 ENCOUNTER — Other Ambulatory Visit: Payer: Self-pay

## 2018-01-01 ENCOUNTER — Ambulatory Visit (HOSPITAL_COMMUNITY)
Admission: EM | Admit: 2018-01-01 | Discharge: 2018-01-01 | Disposition: A | Discharge status: Home / Self Care | Payer: BC Managed Care – PPO | Attending: Internal Medicine | Admitting: Internal Medicine

## 2018-01-01 DIAGNOSIS — J22 Unspecified acute lower respiratory infection: Secondary | ICD-10-CM

## 2018-01-01 MED ORDER — ALBUTEROL SULFATE HFA 108 (90 BASE) MCG/ACT IN AERS: 1.0000 | INHALATION_SPRAY | Freq: Four times a day (QID) | RESPIRATORY_TRACT | 0 refills | Status: DC | PRN

## 2018-01-01 MED ORDER — AZITHROMYCIN 250 MG PO TABS: ORAL_TABLET | ORAL | 0 refills | Status: AC

## 2018-01-01 NOTE — Discharge Instructions (Signed)
Push fluids to ensure adequate hydration and keep secretions thin.  Tylenol and/or ibuprofen as needed for pain or fevers.  Complete course of antibiotics.  Inhaler as needed. Continue with over the counter treatment's as needed for symptoms.  If symptoms worsen or do not improve in the next week to return to be seen or to follow up with your PCP.

## 2018-01-01 NOTE — ED Provider Notes (Signed)
[place]    CSN: [id number] Arrival date & time: 01/01/18  1408     History   Chief Complaint Chief Complaint  Patient presents with  . URI    HPI Carrie Copeland is a 47 y.o. female.   Carrie Copeland presents with complaints of worsening of productive cough with chest tightness that started 6/12. No runny nose, ear pain. Some post nasal drip causing throat irritation. No fevers. No gi/gu complaints. 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 No skin rash. Has been taking mucinex and dayquil/nyquil which minimally helps. No specific asthma history, states she has had inhalers in the past with bronchitis. Does not smoke. No shortness of breath. Without contributing medical history.     ROS per HPI.      Past Medical History:  Diagnosis Date  . No pertinent past medical history     Patient Active Problem List   Diagnosis Date Noted  . RUQ pain 12/06/2011  . Abnormal LFTs 12/06/2011  . Biliary pain 12/06/2011    Past Surgical History:  Procedure Laterality Date  . CESAREAN SECTION      OB History   None      Home Medications    Prior to Admission medications   Medication Sig Start Date End Date Taking? Authorizing Provider  Pseudoeph-Doxylamine-DM-APAP (NYQUIL PO) Take by mouth.   Yes [provider]  Pseudoephedrine-APAP-DM (DAYQUIL PO) Take by mouth.   Yes [provider]  albuterol (PROVENTIL HFA;VENTOLIN HFA) 108 (90 Base) MCG/ACT inhaler Inhale 1-2 puffs into the lungs every 6 (six) hours as needed for wheezing or shortness of breath. 01/01/18   Zigmund Gottron, NP  azithromycin (ZITHROMAX) 250 MG tablet Take 2 tablets (500 mg total) by mouth daily for 1 day, THEN 1 tablet (250 mg total) daily for 4 days. 01/01/18 01/06/18  Zigmund Gottron, NP  cephALEXin (KEFLEX) 500 MG capsule Take 1 capsule (500 mg total) by mouth 3 (three) times daily. 05/04/14   Gregor Hams, MD  etodolac (LODINE) 400 MG tablet Take 400 mg by mouth 2  (two) times daily.    [provider]  levofloxacin (LEVAQUIN) 500 MG tablet Take 1 tablet (500 mg total) by mouth daily. 10/14/15   Konrad Felix, PA    Family History Family History  Problem Relation Age of Onset  . Colon cancer Neg Hx   . Liver disease Neg Hx     Social History Social History   Tobacco Use  . Smoking status: Never Smoker  Substance Use Topics  . Alcohol use: No  . Drug use: No     Allergies   Patient has no known allergies.   Review of Systems Review of Systems   Physical Exam Triage Vital Signs ED Triage Vitals  Enc Vitals Group     BP 01/01/18 1441 124/84     Pulse Rate 01/01/18 1441 (!) 106     Resp 01/01/18 1441 18     Temp 01/01/18 1441 98.4 F (36.9 C)     Temp Source 01/01/18 1441 Oral     SpO2 01/01/18 1441 100 %     Weight --      Height --      Head Circumference --      Peak Flow --      Pain Score 01/01/18 1438 6     Pain Loc --      Pain Edu? --      Excl.  in [place]? --    No data found.  Updated Vital Signs BP 124/84 (BP Location: Left Arm)   Pulse (!) 106   Temp 98.4 F (36.9 C) (Oral)   Resp 18   SpO2 100%   Visual Acuity Right Eye Distance:   Left Eye Distance:   Bilateral Distance:    Right Eye Near:   Left Eye Near:    Bilateral Near:     Physical Exam  Constitutional: She is oriented to person, place, and time. She appears well-developed and well-nourished. No distress.  HENT:  Head: Normocephalic and atraumatic.  Right Ear: Tympanic membrane, external ear and ear canal normal.  Left Ear: Tympanic membrane, external ear and ear canal normal.  Nose: Nose normal.  Mouth/Throat: Uvula is midline, oropharynx is clear and moist and mucous membranes are normal. No tonsillar exudate.  Eyes: Pupils are equal, round, and reactive to light. Conjunctivae and EOM are normal.  Cardiovascular: Regular rhythm and normal heart sounds. Tachycardia present.  Pulmonary/Chest: Effort normal and breath sounds  normal.  Significant strong congested cough; lungs clear   Neurological: She is alert and oriented to person, place, and time.  Skin: Skin is warm and dry.     UC Treatments / Results  Labs (all labs ordered are listed, but only abnormal results are displayed) Labs Reviewed - No data to display  EKG None  Radiology No results found.  Procedures Procedures (including critical care time)  Medications Ordered in UC Medications - No data to display  Initial Impression / Assessment and Plan / UC Course  I have reviewed the triage vital signs and the nursing notes.  Pertinent labs & imaging results that were available during my care of the patient were reviewed by me and considered in my medical decision making (see chart for details).     Worsening productive cough, noted tachycardia, has been a week. Course of azithromycin provided at this time. Use of inhaler as needed. Continue with OTC treatments as needed. Return precautions provided. Patient verbalized understanding and agreeable to plan.    Final Clinical Impressions(s) / UC Diagnoses   Final diagnoses:  Lower respiratory infection     Discharge Instructions     Push fluids to ensure adequate hydration and keep secretions thin.  Tylenol and/or ibuprofen as needed for pain or fevers.  Complete course of antibiotics.  Inhaler as needed. Continue with over the counter treatment's as needed for symptoms.  If symptoms worsen or do not improve in the next week to return to be seen or to follow up with your PCP.     ED Prescriptions    Medication Sig Dispense Auth. Provider   azithromycin (ZITHROMAX) 250 MG tablet Take 2 tablets (500 mg total) by mouth daily for 1 day, THEN 1 tablet (250 mg total) daily for 4 days. 6 tablet Augusto Gamble B, NP   albuterol (PROVENTIL HFA;VENTOLIN HFA) 108 (90 Base) MCG/ACT inhaler Inhale 1-2 puffs into the lungs every 6 (six) hours as needed for wheezing or shortness of breath. 1  Inhaler Zigmund Gottron, NP     Controlled Substance Prescriptions [place] Controlled Substance Registry consulted? Not Applicable   Zigmund Gottron, NP 01/01/18 1538

## 2018-01-01 NOTE — ED Triage Notes (Signed)
Started feeling bad last Wednesday.  Symptoms include: cough and congestion, phlegm is yellow, thick.  Chest soreness with coughing.

## 2018-12-03 ENCOUNTER — Other Ambulatory Visit: Payer: BC Managed Care – PPO

## 2018-12-03 ENCOUNTER — Telehealth: Payer: Self-pay | Admitting: Hematology

## 2018-12-03 DIAGNOSIS — Z20822 Contact with and (suspected) exposure to covid-19: Secondary | ICD-10-CM

## 2018-12-03 NOTE — Telephone Encounter (Signed)
Documented on the wrong patient.  Please disregard

## 2018-12-03 NOTE — Addendum Note (Signed)
Addended by: Kendrick Ranch on: 12/03/2018 11:34 AM   Modules accepted: Orders

## 2018-12-03 NOTE — Telephone Encounter (Signed)
After discussion with Dr. Valere Dross, COVID testing scheduled.

## 2019-09-12 ENCOUNTER — Ambulatory Visit: Payer: BC Managed Care – PPO | Attending: Internal Medicine

## 2019-09-12 DIAGNOSIS — Z23 Encounter for immunization: Secondary | ICD-10-CM

## 2019-09-12 NOTE — Progress Notes (Signed)
   Covid-19 Vaccination Clinic  Name:  Carrie Copeland    MRN: [id number] DOB: 17-Aug-1970  09/12/2019  [name] was observed post Covid-19 immunization for 15 minutes without incidence. She was provided with Vaccine Information Sheet and instruction to access the V-Safe system.   [name] was instructed to call 911 with any severe reactions post vaccine: Marland Kitchen Difficulty breathing  . Swelling of your face and throat  . A fast heartbeat  . A bad rash all over your body  . Dizziness and weakness    Immunizations Administered    Name Date Dose VIS Date Route   Pfizer COVID-19 Vaccine 09/12/2019  3:11 PM 0.3 mL 06/27/2019 Intramuscular   Manufacturer: [place]   Lot: [id number]   [place]: [id number]

## 2019-10-08 ENCOUNTER — Ambulatory Visit: Payer: BC Managed Care – PPO | Attending: Internal Medicine

## 2019-10-08 DIAGNOSIS — Z23 Encounter for immunization: Secondary | ICD-10-CM

## 2019-10-08 NOTE — Progress Notes (Signed)
   Covid-19 Vaccination Clinic  Name:  Carrie Copeland    MRN: [id number] DOB: 1971-02-26  10/08/2019  [name] was observed post Covid-19 immunization for 15 minutes without incident. She was provided with Vaccine Information Sheet and instruction to access the V-Safe system.   [name] was instructed to call 911 with any severe reactions post vaccine: Marland Kitchen Difficulty breathing  . Swelling of face and throat  . A fast heartbeat  . A bad rash all over body  . Dizziness and weakness   Immunizations Administered    Name Date Dose VIS Date Route   Pfizer COVID-19 Vaccine 10/08/2019  3:51 PM 0.3 mL 06/27/2019 Intramuscular   Manufacturer: [place]   Lot: [id number]   [place]: [id number]

## 2020-01-23 ENCOUNTER — Ambulatory Visit: Payer: Self-pay

## 2020-01-23 ENCOUNTER — Ambulatory Visit
Admission: EM | Admit: 2020-01-23 | Discharge: 2020-01-23 | Disposition: A | Discharge status: Home / Self Care | Payer: BC Managed Care – PPO | Attending: Emergency Medicine | Admitting: Emergency Medicine

## 2020-01-23 ENCOUNTER — Encounter: Payer: Self-pay | Admitting: Emergency Medicine

## 2020-01-23 DIAGNOSIS — R300 Dysuria: Secondary | ICD-10-CM | POA: Diagnosis not present

## 2020-01-23 DIAGNOSIS — N3001 Acute cystitis with hematuria: Secondary | ICD-10-CM | POA: Diagnosis present

## 2020-01-23 LAB — POCT URINALYSIS DIP (MANUAL ENTRY)
Blood, UA: NEGATIVE
Glucose, UA: 250 mg/dL — AB
Nitrite, UA: POSITIVE — AB
Protein Ur, POC: 100 mg/dL — AB
Spec Grav, UA: 1.01 (ref 1.010–1.025)
Urobilinogen, UA: >=8 E.U./dL — AB
pH, UA: 5 (ref 5.0–8.0)

## 2020-01-23 MED ORDER — NITROFURANTOIN MONOHYD MACRO 100 MG PO CAPS: 100.0000 mg | ORAL_CAPSULE | Freq: Two times a day (BID) | ORAL | 0 refills | Status: AC

## 2020-01-23 MED ORDER — PHENAZOPYRIDINE HCL 200 MG PO TABS: 200.0000 mg | ORAL_TABLET | Freq: Three times a day (TID) | ORAL | 0 refills | Status: AC

## 2020-01-23 NOTE — Discharge Instructions (Signed)
Urine concerning for UTI Urine culture sent.  We will call you with abnormal results.   Push fluids and get plenty of rest.   Take antibiotic as directed and to completion Take pyridium as prescribed and as needed for symptomatic relief Follow up with PCP if symptoms persists Return here or go to ER if you have any new or worsening symptoms such as fever, worsening abdominal pain, nausea/vomiting, flank pain, etc... 

## 2020-01-23 NOTE — ED Provider Notes (Signed)
[facility]   CC: Burning with urination  SUBJECTIVE:  Carrie Copeland is a 49 y.o. female who complains of urinary frequency, and dysuria for the past 1 day.  Admits to delayed bathroom breaks.  Denies abdominal or flank pain.  Has tried OTC AZO without relief.  Symptoms are made worse with urination.  Admits to similar symptoms in the past with UTI.  Complains of associated nausea.  Denies fever, chills, vomiting, abdominal pain, flank pain, abnormal vaginal discharge or bleeding, hematuria.    LMP: No LMP recorded. (Menstrual status: IUD).  ROS: As in HPI.  All other pertinent ROS negative.     Past Medical History:  Diagnosis Date   No pertinent past medical history    Past Surgical History:  Procedure Laterality Date   CESAREAN SECTION     No Known Allergies No current facility-administered medications on file prior to encounter.   Current Outpatient Medications on File Prior to Encounter  Medication Sig Dispense Refill   [DISCONTINUED] albuterol (PROVENTIL HFA;VENTOLIN HFA) 108 (90 Base) MCG/ACT inhaler Inhale 1-2 puffs into the lungs every 6 (six) hours as needed for wheezing or shortness of breath. 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    Employer: [employer]  Tobacco Use   Smoking status: Never Smoker   Smokeless tobacco: Never Used  Substance and Sexual Activity   Alcohol use: No   Drug use: No   Sexual activity: Not on file  Other Topics Concern   Not on file  Social History Narrative   Not on file   Social Determinants of Health   Financial Resource Strain:    Difficulty of Paying Living Expenses:   Food Insecurity:    Worried About Charity fundraiser in the Last Year:    Youth worker in the Last Year:   Transportation Needs:    Film/video editor (Medical):    Lack of Transportation (Non-Medical):   Physical Activity:    Days of Exercise per Week:    Minutes of Exercise per Session:   Stress:    Feeling of Stress :   Social Connections:    Frequency of Communication with Friends and Family:    Frequency of Social Gatherings with Friends and Family:    Attends Religious Services:    Active Member of Clubs or Organizations:    Attends Music therapist:    Marital Status:   Intimate Partner Violence:    Fear of Current or Ex-Partner:    Emotionally Abused:    Physically Abused:    Sexually Abused:    Family History  Problem Relation Age of Onset   Colon cancer Neg Hx    Liver disease Neg Hx     OBJECTIVE:  Vitals:   01/23/20 0853 01/23/20 0856  BP:  124/89  Pulse:  94  Resp:  18  Temp:  98.2 F (36.8 C)  TempSrc:  Oral  SpO2:  95%  Weight: 210 lb (95.3 kg)   Height: 5\' 6"  (1.676 m)    General appearance: Alert; not in acute distress HEENT: NCAT.  Oropharynx clear.  Lungs: clear to auscultation bilaterally without adventitious breath sounds Heart:  regular rate and rhythm.   Abdomen: soft; non-distended; no tenderness; bowel sounds present; no guarding Back: no CVA tenderness Extremities: no edema; symmetrical with no gross deformities Skin: warm and dry Neurologic: Ambulates from chair to exam table without difficulty Psychological: alert and cooperative; normal mood and affect  Labs Reviewed  POCT URINALYSIS DIP (MANUAL ENTRY) - Abnormal; Notable for the following components:      Result Value   Color, UA red (*)    Glucose, UA =250 (*)    Bilirubin, UA moderate (*)    Ketones, POC UA small (15) (*)    Protein Ur, POC =100 (*)    Urobilinogen, UA >=8.0 (*)    Nitrite, UA Positive (*)    Leukocytes, UA Large (3+) (*)    All other components within normal limits  URINE CULTURE    ASSESSMENT &  PLAN:  1. Dysuria   2. Acute cystitis with hematuria     Meds ordered this encounter  Medications   nitrofurantoin, macrocrystal-monohydrate, (MACROBID) 100 MG capsule    Sig: Take 1 capsule (100 mg total) by mouth 2 (two) times daily.    Dispense:  10 capsule    Refill:  0    Order Specific Question:   Supervising Provider    Answer:   Raylene Everts [[id number]]   phenazopyridine (PYRIDIUM) 200 MG tablet    Sig: Take 1 tablet (200 mg total) by mouth 3 (three) times daily.    Dispense:  6 tablet    Refill:  0    Order Specific Question:   Supervising Provider    Answer:   Raylene Everts [[id number]]   Urine concerning for UTI Urine culture sent.  We will call you with abnormal results.   Push fluids and get plenty of rest.   Take antibiotic as directed and to completion Take pyridium as prescribed and as needed for symptomatic relief Follow up with PCP if symptoms persists Return here or go to ER if you have any new or worsening symptoms such as fever, worsening abdominal pain, nausea/vomiting, flank pain, etc...  Outlined signs and symptoms indicating need for more acute intervention. Patient verbalized understanding. After Visit Summary given.     Lestine Box, PA-C 01/23/20 [phone]

## 2020-01-23 NOTE — ED Triage Notes (Addendum)
Burning and frequency with urine since yesterday.  Pt has hx of UTI's.  Pt took azo this morning at 530

## 2020-02-13 LAB — URINE CULTURE: Culture: 60000 — AB
